# Patient Record
Sex: Female | Born: 1995 | Race: White | Hispanic: No | Marital: Single | Smoking: Never smoker
Health system: Southern US, Community
[De-identification: ages and names within clinical notes are randomized; demographics above are authoritative.]

## PROBLEM LIST (undated history)

## (undated) DIAGNOSIS — D649 Anemia, unspecified: Secondary | ICD-10-CM

## (undated) HISTORY — PX: TONSILLECTOMY: SHX5217

## (undated) HISTORY — DX: Anemia, unspecified: D64.9

---
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
## 2015-09-23 ENCOUNTER — Ambulatory Visit: Payer: Self-pay | Admitting: Neurology

## 2019-09-27 ENCOUNTER — Ambulatory Visit: Payer: Self-pay

## 2021-03-24 ENCOUNTER — Other Ambulatory Visit (HOSPITAL_COMMUNITY): Payer: Self-pay | Admitting: Registered Nurse

## 2021-03-24 DIAGNOSIS — R011 Cardiac murmur, unspecified: Secondary | ICD-10-CM

## 2021-04-07 ENCOUNTER — Other Ambulatory Visit: Payer: Self-pay

## 2021-04-19 ENCOUNTER — Other Ambulatory Visit: Payer: Self-pay

## 2021-05-04 ENCOUNTER — Other Ambulatory Visit: Payer: Self-pay

## 2021-10-28 ENCOUNTER — Ambulatory Visit (HOSPITAL_COMMUNITY)
Admission: EM | Admit: 2021-10-28 | Discharge: 2021-10-28 | Disposition: A | Discharge status: Home / Self Care | Payer: No Typology Code available for payment source | Attending: Emergency Medicine | Admitting: Emergency Medicine

## 2021-10-28 ENCOUNTER — Other Ambulatory Visit: Payer: Self-pay

## 2021-10-28 ENCOUNTER — Encounter (HOSPITAL_COMMUNITY): Payer: Self-pay | Admitting: Emergency Medicine

## 2021-10-28 DIAGNOSIS — N760 Acute vaginitis: Secondary | ICD-10-CM | POA: Insufficient documentation

## 2021-10-28 MED ORDER — FLUCONAZOLE 150 MG PO TABS: 150.0000 mg | ORAL_TABLET | Freq: Once | ORAL | 0 refills | Status: AC

## 2021-10-28 NOTE — Discharge Instructions (Addendum)
Take Diflucan as directed and do vaginal sitz bath's with warm water and Epsom salts 2-3 times a day for the next couple of days.  You will get the culture results back and will be called in a couple of days if further medication is needed.  No intercourse until vaginal symptoms completely resolve.  Return for any new or worsening symptoms for reevaluation at any time ?

## 2021-10-28 NOTE — ED Provider Notes (Signed)
?[place] ? ? ?MRN: [id number] DOB: [birth year] ? ?Subjective:  ? ?Chief Complaint;  ?Chief Complaint  ?Patient presents with  ? Vaginal Discharge  ? ?Seen at a ucc for uti, took keflex.  Last Wednesday noticed vaginal discharge.  Took 4 days of monistat.  Stopped that medicine on Saturday.  Since then has had redness, itching and swelling after sexual intercourse and painful intercourse.  Started menstrual cycle yesterday ? ?Michelle Dennis is a 26 y.o. female presenting for vaginal irritation post intercourse yesterday.  She was initially treated with antibiotic for UTI and subsequently developed a yeast infection.  Patient took Monistat for 4 days and stopped but after intercourse symptoms started again although she had no obvious discharge or itching she has complaints more consistent with vaginal irritation.  She denies fever fishy odor or dysuria. ? ?No current facility-administered medications for this encounter. ? ?Current Outpatient Medications:  ?  fluconazole (DIFLUCAN) 150 MG tablet, Take 1 tablet (150 mg total) by mouth once for 1 dose. Repeat in 3 days if not improving, Disp: 2 tablet, Rfl: 0 ?  ARIPiprazole (ABILIFY) 2 MG tablet, Take 1 tablet (2 mg total) by mouth daily. (Patient not taking: Reported on 10/28/2021), Disp: 30 tablet, Rfl: 1 ?  drospirenone-ethinyl estradiol (YAZ,GIANVI,LORYNA) 3-0.02 MG tablet, Take 1 tablet by mouth daily. (Patient not taking: Reported on 10/28/2021), Disp: , Rfl:   ? ?No Known Allergies ? ?Past Medical History:  ?Diagnosis Date  ? Anemia 12/12  ?  ? ?Review of Systems  ?All other systems reviewed and are negative. ? ? ?Objective:  ? ?Vitals: ?BP 115/75 (BP Location: Right Arm)   Pulse 60   Temp 98.3 ?F (36.8 ?C) (Oral)   Resp 18   LMP 10/27/2021 (Exact Date)   SpO2 100%  ? ?Physical Exam ?Constitutional:   ?   General: She is not in acute distress. ?   Appearance: Normal appearance. She is not toxic-appearing.  ?HENT:  ?   Head: Atraumatic.  ?    Right Ear: External ear normal.  ?   Left Ear: External ear normal.  ?   Nose: Nose normal.  ?   Mouth/Throat:  ?   Mouth: Mucous membranes are moist.  ?Eyes:  ?   General: No scleral icterus.    ?   Right eye: No discharge.     ?   Left eye: No discharge.  ?   Conjunctiva/sclera: Conjunctivae normal.  ?Cardiovascular:  ?   Rate and Rhythm: Normal rate and regular rhythm.  ?Pulmonary:  ?   Effort: Pulmonary effort is normal.  ?   Breath sounds: Normal breath sounds.  ?Abdominal:  ?   General: There is no distension.  ?   Palpations: Abdomen is soft.  ?   Tenderness: There is no abdominal tenderness. There is no right CVA tenderness, left CVA tenderness, guarding or rebound.  ?Genitourinary: ?   Rectum: Normal.  ?   Comments: External posterior vulva positive irritation with mild erythema and swelling noted as well as introitus tenderness.  Intravaginal vault without obvious discharge other than blood, patient is on her menstrual cycle.  CMT is negative.  No open lesions external or internal vaginal. ?Skin: ?   General: Skin is warm and dry.  ?   Capillary Refill: Capillary refill takes less than 2 seconds.  ?Neurological:  ?   General: No focal deficit present.  ?   Mental Status: She is alert and oriented to  person, place, and time.  ?Psychiatric:     ?   Mood and Affect: Mood normal.  ? ? ?No results found for this or any previous visit (from the past 24 hour(s)). ? ?No results found.  ?  ? ?Assessment and Plan :  ? ?1. Acute vaginitis   ? ? ?Meds ordered this encounter  ?Medications  ? fluconazole (DIFLUCAN) 150 MG tablet  ?  Sig: Take 1 tablet (150 mg total) by mouth once for 1 dose. Repeat in 3 days if not improving  ?  Dispense:  2 tablet  ?  Refill:  0  ? ? ?MDM:  ?Michelle Dennis is a 26 y.o. female presenting for symptoms of vaginitis.  She did take a short course of Monistat but started burning afterwards so she stopped currently she has localized inflammation on exam without significant evidence of  secondary infection.  Optima swab is pending patient will be called with results she is encouraged to take 1 Diflucan tablet and avoid intercourse until vaginal symptoms resolved.  I discussed treatment, follow up and return instructions. Questions were answered. Patient stated understanding of instructions and is stable for discharge. ? ?Leida Lauth FNP-C [facility]  ?  ?Hezzie Bump, NP ?10/28/21 1027 ? ?

## 2021-10-28 NOTE — ED Triage Notes (Signed)
Seen at a ucc for uti, took keflex.  Last Wednesday noticed vaginal discharge.  Took 4 days of monistat.  Stopped that medicine on Saturday.  Since then has had redness, itching and swelling after sexual intercourse and painful intercourse.  Started menstrual cycle yesterday ?

## 2021-10-29 ENCOUNTER — Telehealth (HOSPITAL_COMMUNITY): Payer: Self-pay | Admitting: Emergency Medicine

## 2021-10-29 LAB — CERVICOVAGINAL ANCILLARY ONLY
Bacterial Vaginitis (gardnerella): POSITIVE — AB
Candida Glabrata: NEGATIVE
Candida Vaginitis: NEGATIVE
Comment: NEGATIVE
Comment: NEGATIVE
Comment: NEGATIVE

## 2021-10-29 MED ORDER — METRONIDAZOLE 500 MG PO TABS: 500.0000 mg | ORAL_TABLET | Freq: Two times a day (BID) | ORAL | 0 refills | Status: AC
